# Patient Record
Sex: Male | Born: 1950 | Race: Asian | Hispanic: Yes | Marital: Married | Smoking: Former smoker
Health system: Southern US, Community
[De-identification: ages and names within clinical notes are randomized; demographics above are authoritative.]

## PROBLEM LIST (undated history)

## (undated) DIAGNOSIS — A809 Acute poliomyelitis, unspecified: Secondary | ICD-10-CM

## (undated) DIAGNOSIS — I10 Essential (primary) hypertension: Secondary | ICD-10-CM

---
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
## 2014-07-07 ENCOUNTER — Emergency Department (HOSPITAL_COMMUNITY)
Admission: EM | Admit: 2014-07-07 | Discharge: 2014-07-08 | Disposition: A | Discharge status: Home / Self Care | Payer: Medicare Other | Attending: Emergency Medicine | Admitting: Emergency Medicine

## 2014-07-07 ENCOUNTER — Emergency Department (HOSPITAL_COMMUNITY): Payer: Medicare Other

## 2014-07-07 ENCOUNTER — Encounter (HOSPITAL_COMMUNITY): Payer: Self-pay | Admitting: Emergency Medicine

## 2014-07-07 DIAGNOSIS — Z87891 Personal history of nicotine dependence: Secondary | ICD-10-CM | POA: Insufficient documentation

## 2014-07-07 DIAGNOSIS — Z7982 Long term (current) use of aspirin: Secondary | ICD-10-CM | POA: Insufficient documentation

## 2014-07-07 DIAGNOSIS — IMO0002 Reserved for concepts with insufficient information to code with codable children: Secondary | ICD-10-CM | POA: Insufficient documentation

## 2014-07-07 DIAGNOSIS — S42211A Unspecified displaced fracture of surgical neck of right humerus, initial encounter for closed fracture: Secondary | ICD-10-CM

## 2014-07-07 DIAGNOSIS — S6990XA Unspecified injury of unspecified wrist, hand and finger(s), initial encounter: Secondary | ICD-10-CM | POA: Insufficient documentation

## 2014-07-07 DIAGNOSIS — W010XXA Fall on same level from slipping, tripping and stumbling without subsequent striking against object, initial encounter: Secondary | ICD-10-CM | POA: Insufficient documentation

## 2014-07-07 DIAGNOSIS — S42213A Unspecified displaced fracture of surgical neck of unspecified humerus, initial encounter for closed fracture: Secondary | ICD-10-CM | POA: Insufficient documentation

## 2014-07-07 DIAGNOSIS — I10 Essential (primary) hypertension: Secondary | ICD-10-CM | POA: Insufficient documentation

## 2014-07-07 DIAGNOSIS — S62319A Displaced fracture of base of unspecified metacarpal bone, initial encounter for closed fracture: Secondary | ICD-10-CM | POA: Insufficient documentation

## 2014-07-07 DIAGNOSIS — Z79899 Other long term (current) drug therapy: Secondary | ICD-10-CM | POA: Insufficient documentation

## 2014-07-07 DIAGNOSIS — Y9389 Activity, other specified: Secondary | ICD-10-CM | POA: Diagnosis not present

## 2014-07-07 DIAGNOSIS — Y9289 Other specified places as the place of occurrence of the external cause: Secondary | ICD-10-CM | POA: Diagnosis not present

## 2014-07-07 DIAGNOSIS — Z8619 Personal history of other infectious and parasitic diseases: Secondary | ICD-10-CM | POA: Diagnosis not present

## 2014-07-07 DIAGNOSIS — S62511A Displaced fracture of proximal phalanx of right thumb, initial encounter for closed fracture: Secondary | ICD-10-CM

## 2014-07-07 HISTORY — DX: Acute poliomyelitis, unspecified: A80.9

## 2014-07-07 HISTORY — DX: Essential (primary) hypertension: I10

## 2014-07-07 NOTE — ED Notes (Signed)
Pt states he has hx of polio and had sx on his rt arm when he was a child to repair damage from the polio. Arm has limited movement. Pt states that he tripped and fell and landed on the right arm. Now his arm cannot move at all.

## 2014-07-08 ENCOUNTER — Emergency Department (HOSPITAL_COMMUNITY): Payer: Medicare Other

## 2014-07-08 MED ORDER — HYDROCODONE-ACETAMINOPHEN 5-325 MG PO TABS: 1.0000 | ORAL_TABLET | ORAL | Status: AC | PRN

## 2014-07-08 MED ORDER — HYDROCODONE-ACETAMINOPHEN 5-325 MG PO TABS
2.0000 | ORAL_TABLET | Freq: Once | ORAL | Status: AC
  Administered 2014-07-08: 2 via ORAL
  Filled 2014-07-08: qty 2

## 2014-07-08 NOTE — ED Provider Notes (Addendum)
CSN: [id number]     Arrival date & time 07/07/14  2245 History   First MD Initiated Contact with Patient 07/07/14 2335     Chief Complaint  Patient presents with  . 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 Patient states he "got his foot stuck in a chair, fell to the floor landing on his right elbow and right wrist approximately 2 hours ago. Patient complaining of right shoulder pain, right wrist pain.  Patient has limited function from residual neuro deficit from polio when he was [age] years of age. Patient denies any other injury, loss of consciousness. Patient denies any weakness, numbness, tingling. Patient describes his pain as a sharp, constant pain in his shoulder and his right wrist. He states the pain does not radiate, and he has not tried taking anything to help alleviate his pain. He states there are no alleviating factors, any movement is an aggravating factor.  Past Medical History  Diagnosis Date  . Polio   . Hypertension    Past Surgical History  Procedure Laterality Date  . Shoulder surgery  1963   No family history on file. History  Substance Use Topics  . Smoking status: Former Smoker    Quit date: 03/06/2014  . Smokeless tobacco: Never Used  . Alcohol Use: Yes     Comment: socially    Review of Systems  Musculoskeletal: Positive for arthralgias.  Neurological: Negative for syncope, weakness and numbness.      Allergies  Review of patient's allergies indicates no known allergies.  Home Medications   Prior to Admission medications   Medication Sig Start Date End Date Taking? Authorizing Provider  Ascorbic Acid (VITAMIN C) 1000 MG tablet Take 1,000 mg by mouth daily.   Yes Historical Provider, MD  aspirin EC 81 MG tablet Take 81 mg by mouth  daily.   Yes Historical Provider, MD  HYDROcodone-acetaminophen (NORCO/VICODIN) 5-325 MG per tablet Take 1-2 tablets by mouth every 4 (four) hours as needed for moderate pain or severe pain. 07/08/14   Monte Fantasia, PA-C  lisinopril (PRINIVIL,ZESTRIL) 10 MG tablet Take 10 mg by mouth daily.   Yes Historical Provider, MD  VITAMIN D, CHOLECALCIFEROL, PO Take 1 capsule by mouth daily.   Yes Historical Provider, MD   BP 134/85  Pulse 91  Temp(Src) 98.1 F (36.7 C) (Oral)  Resp 16  Ht  (1.676 m)  Wt 165 lb (74.844 kg)  BMI 26.64 kg/m2  SpO2 95% Physical Exam  Nursing note and vitals reviewed. Constitutional: He appears well-developed and well-nourished. No distress.  HENT:  Head: Normocephalic and atraumatic.  Eyes: Conjunctivae are normal. Right eye exhibits no discharge. Left eye exhibits no discharge. No scleral icterus.  Cardiovascular:  Peripheral pulses intact at injured extremity.   Pulmonary/Chest: Effort normal. No respiratory distress.  Musculoskeletal:  Right shoulder exam: Range of motion limited due to to past glenohumeral fusion. Tenderness to palpation of proximal humeral region. Patient states she does not have much range of motion of the shoulder at baseline. Tenderness noted to pt's thumb. No anatomic snuffbox tenderness. Pt has full passive ROM to his wrist without tenderness. Pt has limited active ROM of his wrist which he states is baseline for him. Mild swelling is noted to the lateral portion of his thumb and distal metacarpals. Mild ecchymosis noted on exam.  Radial  pulse 2+, capillary refill less than 2 seconds, patient able to move all fingers. Patient states he does not feel he has lost any function of his fingers or hand.  Motor strength difficult to assess due to patient's residual neurologic deficit from his polio. Patient states he does not feel as though he has lost any strength or function from his baseline in his right hand. Distal sensation intact.    Neurological: He is alert.  No numbness distal to injury.    Skin: Skin is warm and dry. No rash noted. He is not diaphoretic.    ED Course  Procedures (including critical care time) Labs Review Labs Reviewed - No data to display  Imaging Review Dg Shoulder Right  07/08/2014   CLINICAL DATA:  Right shoulder her wrist, hand pain after colon. History of polio and shoulder surgery.  EXAM: RIGHT SHOULDER - 2+ VIEW  COMPARISON:  None.  FINDINGS: Positioning is nonstandard. There is no acute fracture of the right humeral neck. Suspect chronic fused glenohumeral joint. Apex of the lung is unremarkable.  IMPRESSION: Acute fracture of the right humeral neck.  Fused glenohumeral joint.   Electronically Signed   By: Rosalie Gums M.D.   On: 07/08/2014 00:34   Dg Wrist Complete Right  07/08/2014   CLINICAL DATA:  Wrist and hand pain after falling. History of polio.  EXAM: RIGHT WRIST - COMPLETE 3+ VIEW  COMPARISON:  None.  FINDINGS: There is deformity of the wrist. Ulna minus variance is noted. There is lucency traversing the distal radius, raising the question of minimally displaced fracture. There is an acute fracture of the proximal phalanx of the thumb and base of the third metacarpal. Deformity of the second metacarpal may be chronic and is not completely evaluated. Dedicated views of the hand may be helpful.  IMPRESSION: 1. Deformity of the wrist, chronic deformity of the wrist. Question of an oblique fracture of the distal radius with minimal displacement. 2. Acute fractures of the proximal phalanx of the thumb and the third metacarpal. 3. Deformity of the second metacarpal. 4. Further evaluation hand is recommended.   Electronically Signed   By: Rosalie Gums M.D.   On: 07/08/2014 00:38   Dg Hand Complete Right  07/08/2014   CLINICAL DATA:  Question of fracture on previous wrist x-ray.  EXAM: RIGHT HAND - COMPLETE 3+ VIEW  COMPARISON:  07/07/2014  FINDINGS: There is deformity of the wrist, consistent  with a history of polio. Ulnar minus variance is again noted. There is an acute fracture of the proximal phalanx of the thumb. Third proximal metacarpal fracture is again noted. Deformity of the second metacarpal is consistent with old injury. No definite fracture identified in the distal radius. There is a shortened fourth metacarpal.  IMPRESSION: 1. Acute fractures of the proximal phalanx of the thumb and the proximal aspect of the third metacarpal. 2. Probable old fracture of the second metacarpal. 3. Fracture the distal radius is felt to be less likely.   Electronically Signed   By: Rosalie Gums M.D.   On: 07/08/2014 02:03     EKG Interpretation None      MDM   Final diagnoses:  Fracture of proximal phalanx of right thumb, closed, initial encounter  Fracture of metacarpal base of right hand, closed, initial encounter  Humeral surgical neck fracture, right, closed, initial encounter    63 year old male past medical history of polio with residual neurologic deficit in right upper extremity, presenting after a fall on  right upper extremity.  Radiographs shows impression of: Right hand/wrist: 1. Acute fractures of the proximal phalanx of the thumb and the proximal aspect of the third metacarpal. 2. Probable old fracture of the second metacarpal.  Right shoulder:  Acute fracture of the right humeral neck. Fused glenohumeral joint.  Patient's pain treated in the ER. Patient's arm is placed in a sling, which patient states alleviated his pain a remarkable amount. With patient having very limited function of his right forearm and right hand, the decision was made not to splint his forearm due to the fact that he typically does not move his right forearm, and he has a chronic deformity of the right hand.  No anatomic snuffbox tenderness, pt has full passive ROM of his wrist without pain.  Splinting it could potentially make his arm heavier, and cause more discomfort for his upper arm in the  sling. Patient states he typically keeps his hand and wrist still, and agrees that having his wrist lay in the sling without splinting may actually be more comfortable for him do to his chronic deformity of his right hand. Patient discharged with pain medications and strongly encouraged to followup with orthopedics and hand surgery in the morning. Patient was encouraged to call or return to the ER should his symptoms worsen, or should he have any questions or concerns.   BP 134/85  Pulse 91  Temp(Src) 98.1 F (36.7 C) (Oral)  Resp 16  Ht  (1.676 m)  Wt 165 lb (74.844 kg)  BMI 26.64 kg/m2  SpO2 95%   Signed,  Ladona Mow, PA-C 4:02 AM  Pt discussed with Dr Tomasita Crumble, MD.        Monte Fantasia, PA-C 07/08/14 0402  Monte Fantasia, PA-C 07/11/14 1649

## 2014-07-08 NOTE — Discharge Instructions (Signed)
Use hydrocodone 1-2 pills every 4-6 hours as needed for pain. Followup with orthopedics, and hand surgery in the morning. Return to the ER should your symptoms worsen, or should he have a questions or concerns.   Humerus Fracture, Treated with Immobilization The humerus is the large bone in your upper arm. You have a broken (fractured) humerus. These fractures are easily diagnosed with X-rays. TREATMENT  Simple fractures which will heal without disability are treated with simple immobilization. Immobilization means you will wear a cast, splint, or sling. You have a fracture which will do well with immobilization. The fracture will heal well simply by being held in a good position until it is stable enough to begin range of motion exercises. Do not take part in activities which would further injure your arm.  HOME CARE INSTRUCTIONS   Put ice on the injured area.  Put ice in a plastic bag.  Place a towel between your skin and the bag.  Leave the ice on for 15-20 minutes, 03-04 times a day.  If you have a cast:  Do not scratch the skin under the cast using sharp or pointed objects.  Check the skin around the cast every day. You may put lotion on any red or sore areas.  Keep your cast dry and clean.  If you have a splint:  Wear the splint as directed.  Keep your splint dry and clean.  You may loosen the elastic around the splint if your fingers become numb, tingle, or turn cold or blue.  If you have a sling:  Wear the sling as directed.  Do not put pressure on any part of your cast or splint until it is fully hardened.  Your cast or splint can be protected during bathing with a plastic bag. Do not lower the cast or splint into water.  Only take over-the-counter or prescription medicines for pain, discomfort, or fever as directed by your caregiver.  Do range of motion exercises as instructed by your caregiver.  Follow up as directed by your caregiver. This is very important in  order to avoid permanent injury or disability and chronic pain. SEEK IMMEDIATE MEDICAL CARE IF:   Your skin or nails in the injured arm turn blue or gray.  Your arm feels cold or numb.  You develop severe pain in the injured arm.  You are having problems with the medicines you were given. MAKE SURE YOU:   Understand these instructions.  Will watch your condition.  Will get help right away if you are not doing well or get worse. Document Released: 01/17/2001 Document Revised: 01/03/2012 Document Reviewed: 11/25/2010 [facility] Patient Information 2015 [place], [place]. This information is not intended to replace advice given to you by your health care provider. Make sure you discuss any questions you have with your health care provider.   Metacarpal Fractures Fractures of metacarpals are breaks in the bones of the hand. They extend from the knuckles to the wrist. These bones can break in many ways. There are different ways of treating these fractures. HOME CARE  Only exercise as told by your doctor.  Return to activities as told by your doctor.  Go to physical therapy as told by your doctor.  Follow your doctor's advice about driving.  Keep the injured hand raised (elevated) above the level of your heart.  If a plaster, fiberglass, or pre-formed splint was applied:  Wear your splint as told and until you are examined again.  Apply ice on the injury  for 15-20 minutes at a time, 03-04 times a day. Put the ice in a plastic bag. Place a towel between your skin and the bag.  Do not get your splint or cast wet. Protect it during bathing with a plastic bag.  Loosen the elastic bandage around the splint if your fingers start to get numb, tingle, get cold, or turn blue.  If the splint is plaster, do not lean it on hard surfaces or put pressure on it for 24 hours after it is put on.  Do not  try to scratch the skin under the cast.  Check the skin around the cast every day. You may  put lotion on red or sore areas.  Move the fingers of your casted hand several times a day.  Only take medicine as told by your doctor.  Follow up as told by your doctor. This is very important in order to avoid permanent injury, disability, or lasting (chronic) pain. GET HELP RIGHT AWAY IF:   You develop a rash.  You have problems breathing.  You have any allergy problems.  You have more than a small spot of blood from beneath your cast or splint.  You have redness, puffiness (swelling), or more pain from beneath your cast or splint.  Yellowish-white fluid (pus) comes from beneath your cast or splint.  You develop a temperature by mouth above 102 F (38.9 C), not controlled by medicine.  You have a bad smell coming from under your cast or splint.  You have problems moving any of your fingers. If you do not have a window in your cast for looking at the wound, a fluid or a little bleeding may show up as a stain on the outside of your cast. Tell your doctor about any stains you see. MAKE SURE YOU:   Understand these instructions.  Will watch your condition.  Will get help right away if you are not doing well or get worse. Document Released: 03/29/2008 Document Revised: 02/25/2014 Document Reviewed: 09/16/2009 [facility] Patient Information 2015 [place], [place]. This information is not intended to replace advice given to you by your health care provider. Make sure you discuss any questions you have with your health care provider.

## 2014-07-08 NOTE — ED Provider Notes (Signed)
Medical screening examination/treatment/procedure(s) were performed by non-physician practitioner and as supervising physician I was immediately available for consultation/collaboration.   EKG Interpretation None        Tomasita Crumble, MD 07/08/14 1620

## 2014-07-12 NOTE — ED Provider Notes (Signed)
Medical screening examination/treatment/procedure(s) were performed by non-physician practitioner and as supervising physician I was immediately available for consultation/collaboration.   EKG Interpretation None        Shlonda Dolloff, MD 07/12/14 1824 

## 2015-04-18 IMAGING — CR DG SHOULDER 2+V*R*
2 series · 2 of 2 positions shown · non-contrast
Comparison: None.

CLINICAL DATA: Right shoulder her wrist, hand pain after colon.
History of polio and shoulder surgery.

EXAM:
RIGHT SHOULDER - 2+ VIEW

[w shoulder internal right]
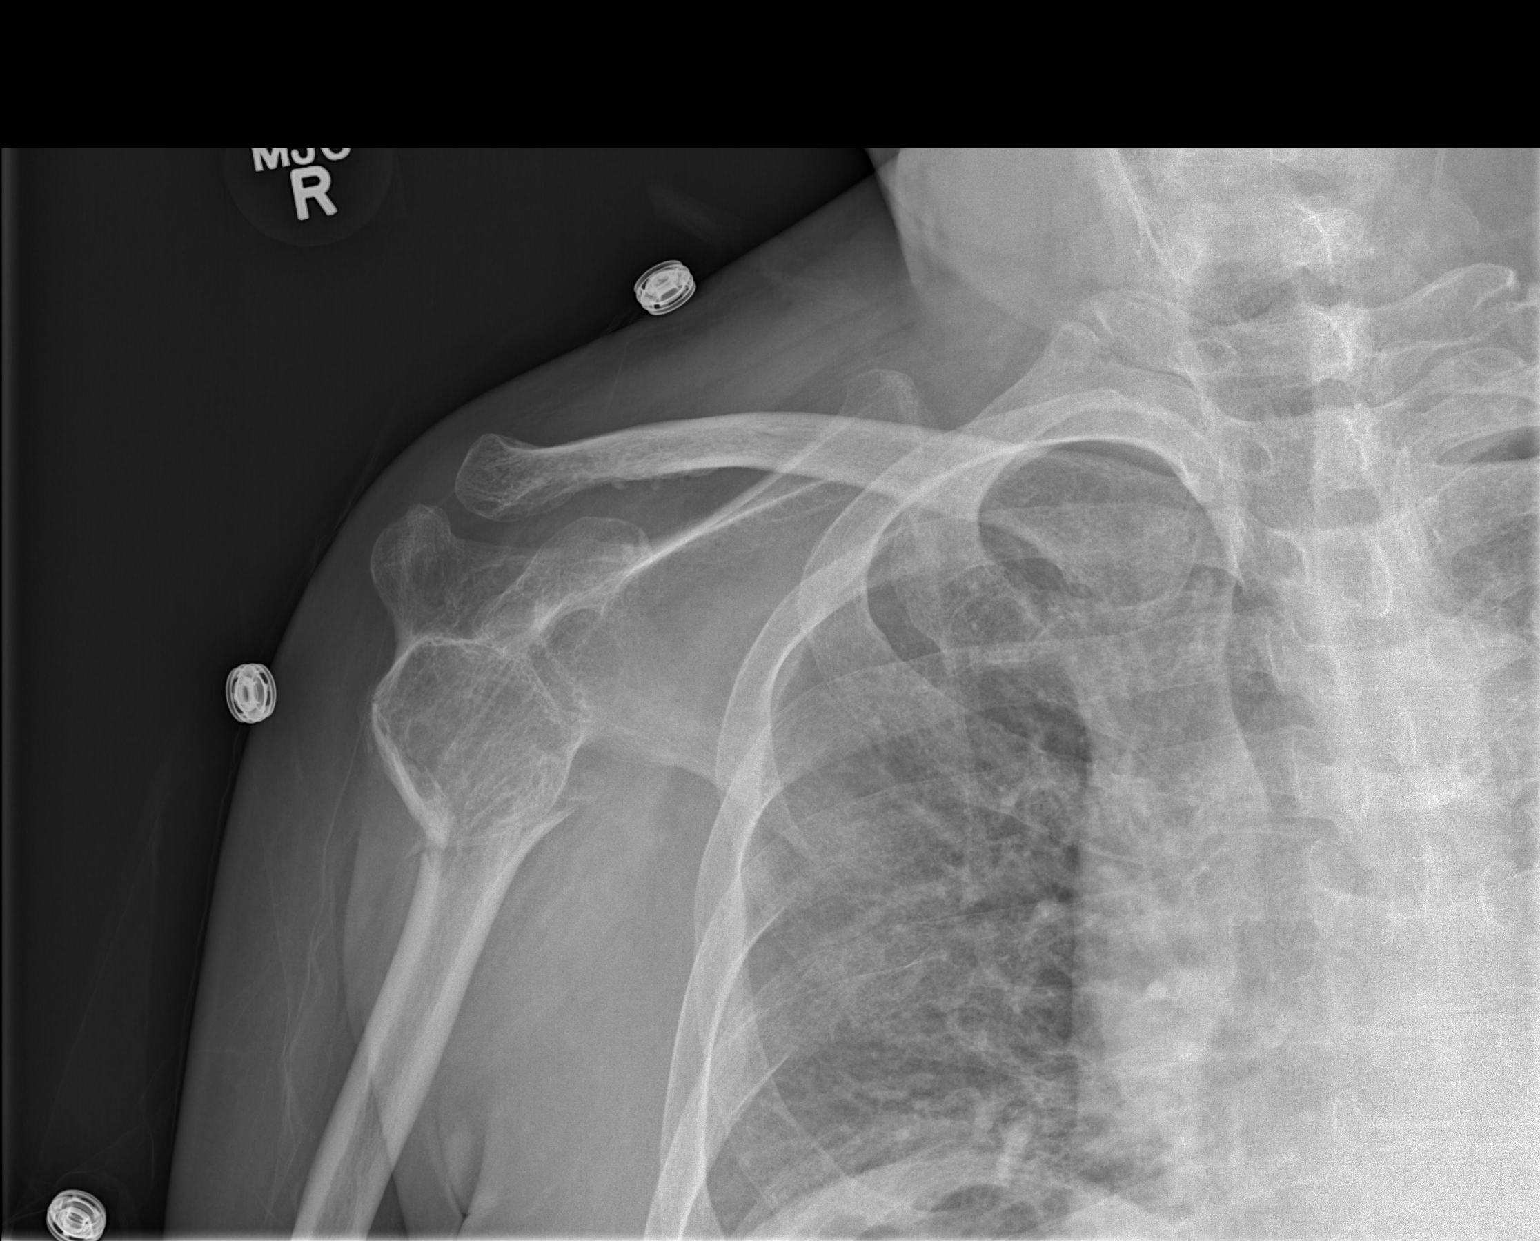

[w shoulder y-view right]
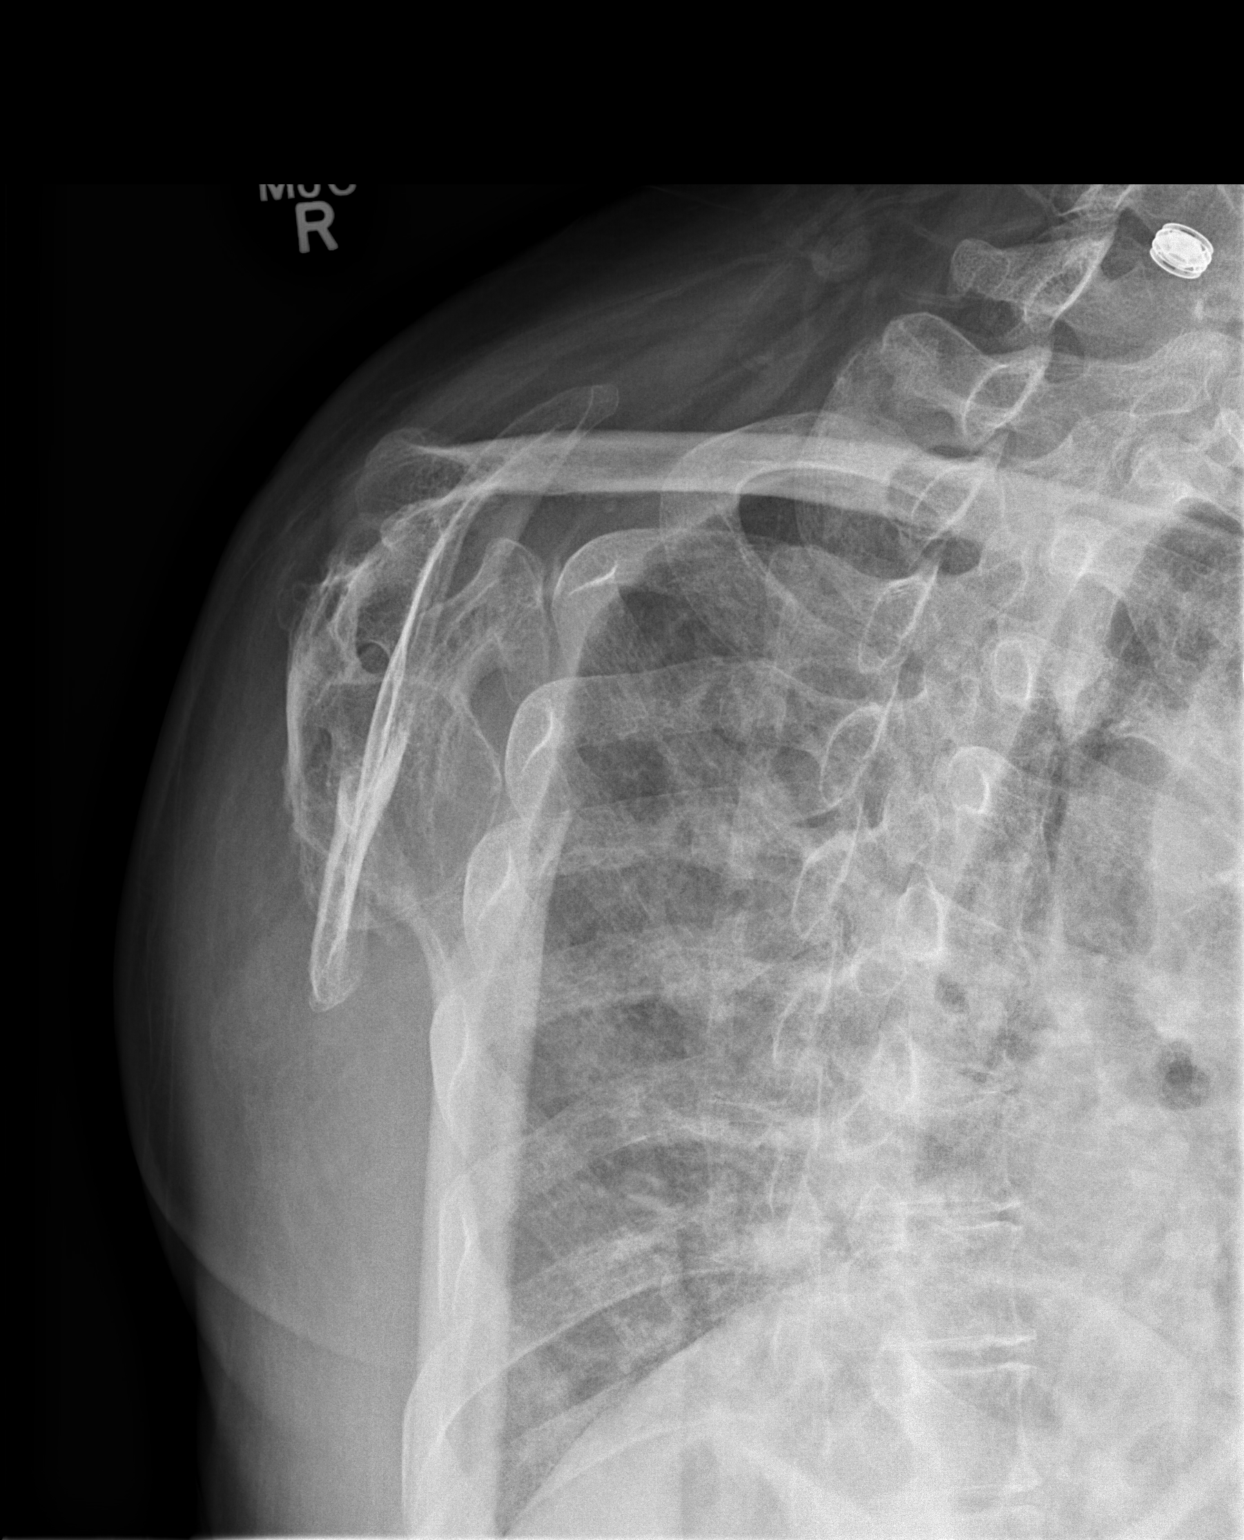

[2 of 2 positions shown; findings below may reference images not displayed]

FINDINGS: Positioning is nonstandard. There is no acute fracture of the right
humeral neck. Suspect chronic fused glenohumeral joint. Apex of the
lung is unremarkable.
IMPRESSION: Acute fracture of the right humeral neck.  Fused glenohumeral joint.

## 2015-04-19 IMAGING — CR DG HAND COMPLETE 3+V*R*
3 series · 3 of 3 positions shown · non-contrast
Comparison: 07/07/2014

CLINICAL DATA: Question of fracture on previous wrist x-ray.

EXAM:
RIGHT HAND - COMPLETE 3+ VIEW

[x hand pa right]
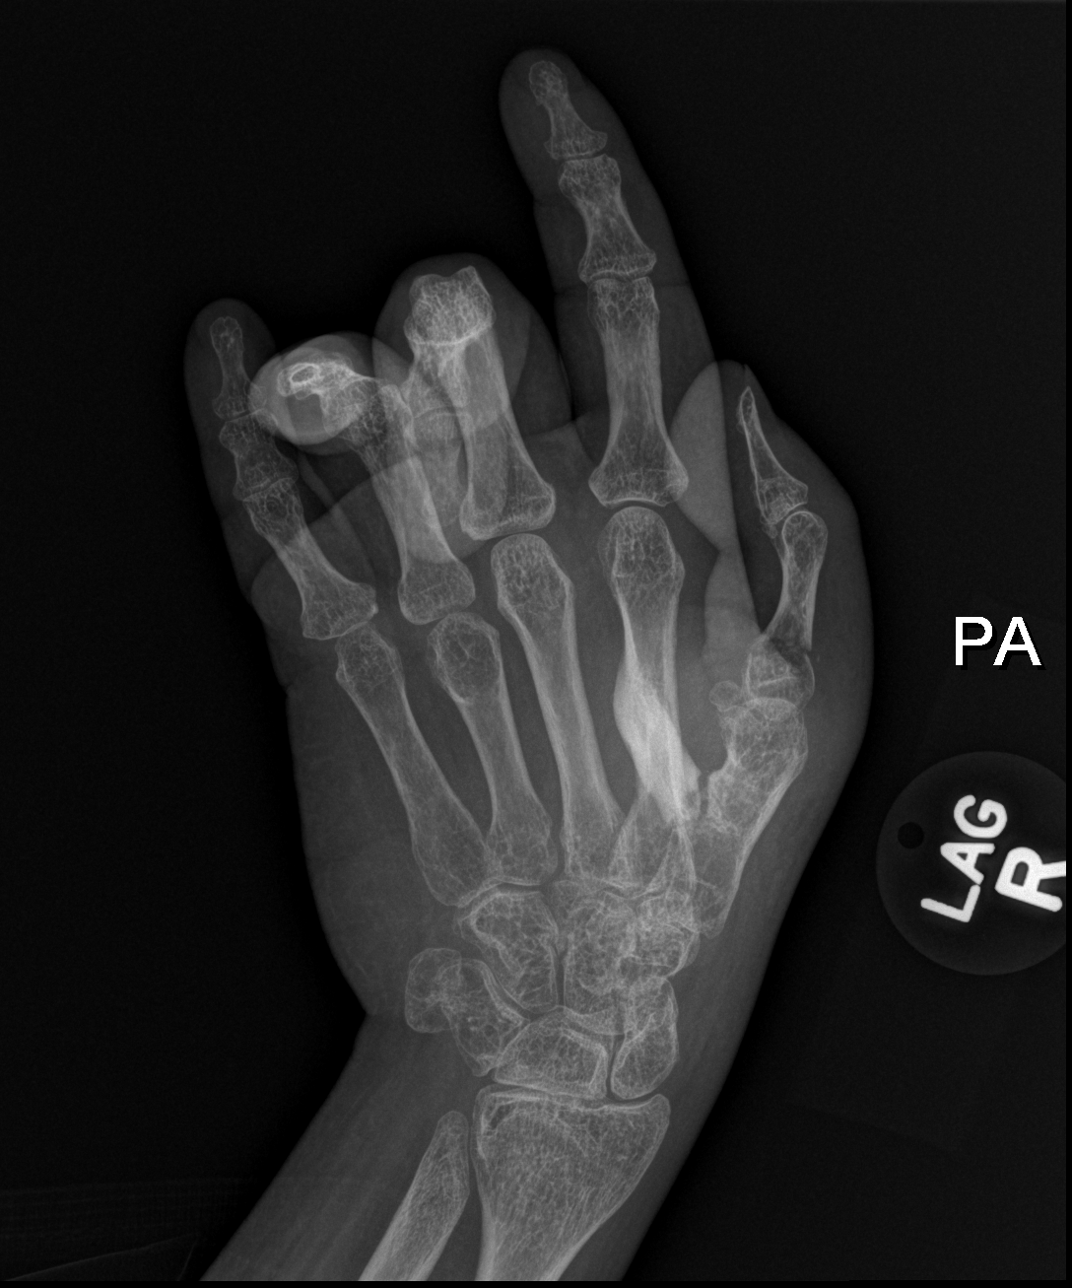

[x hand lat right]
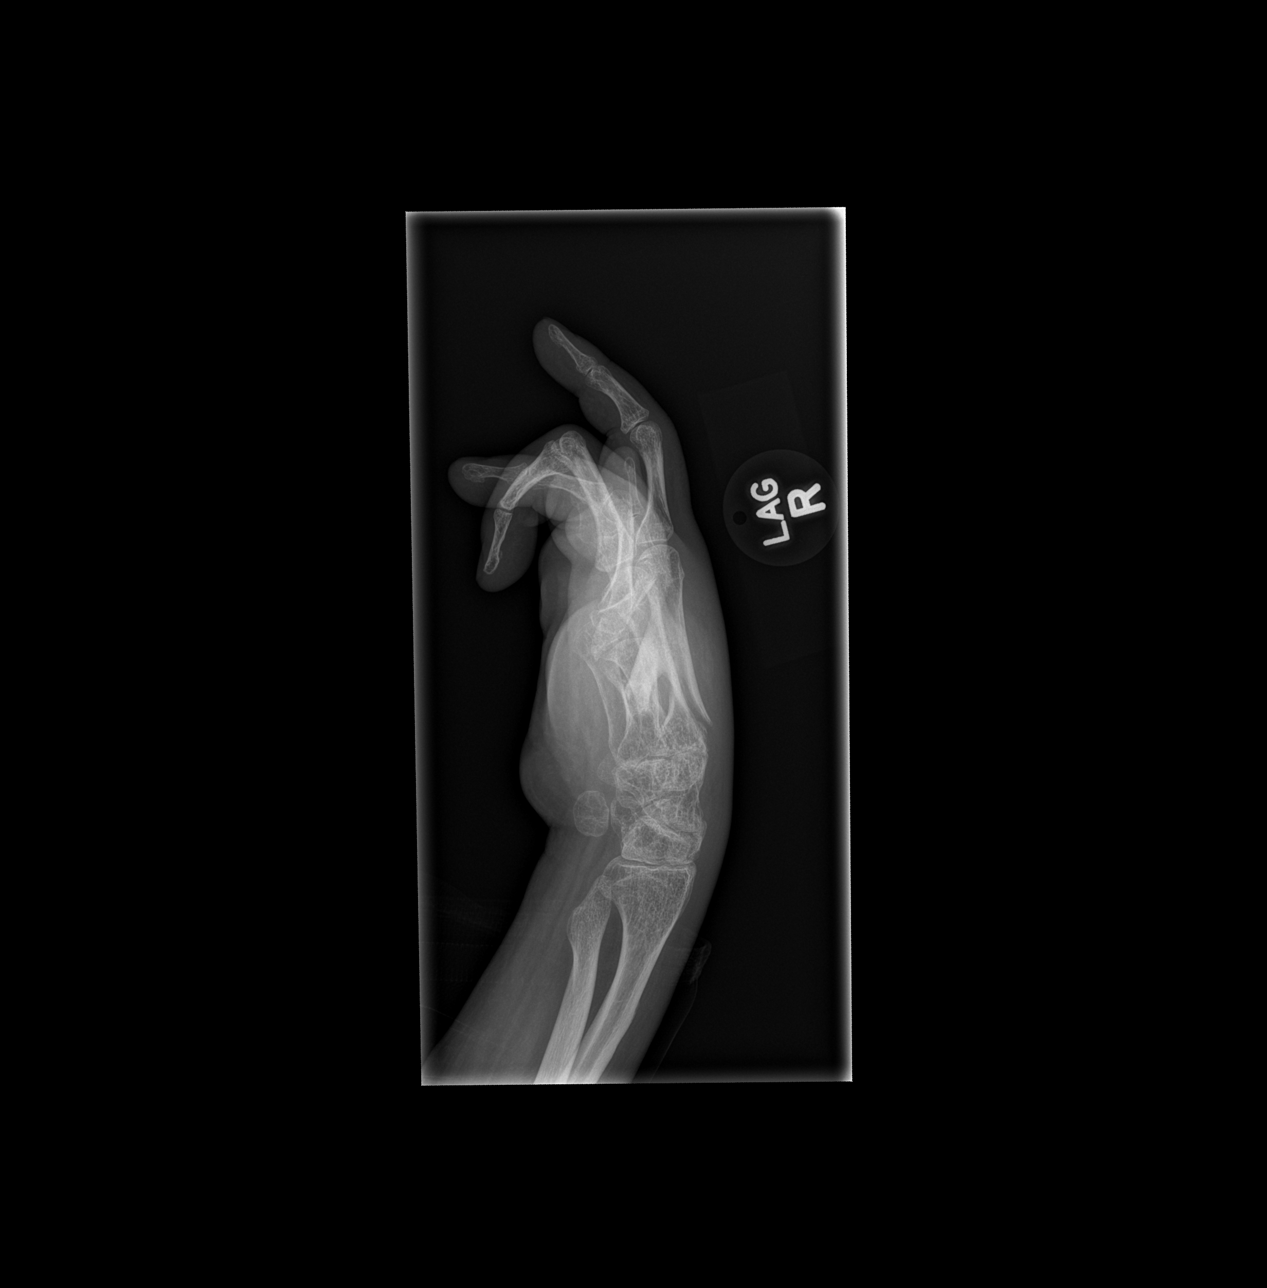

[x hand obl right]
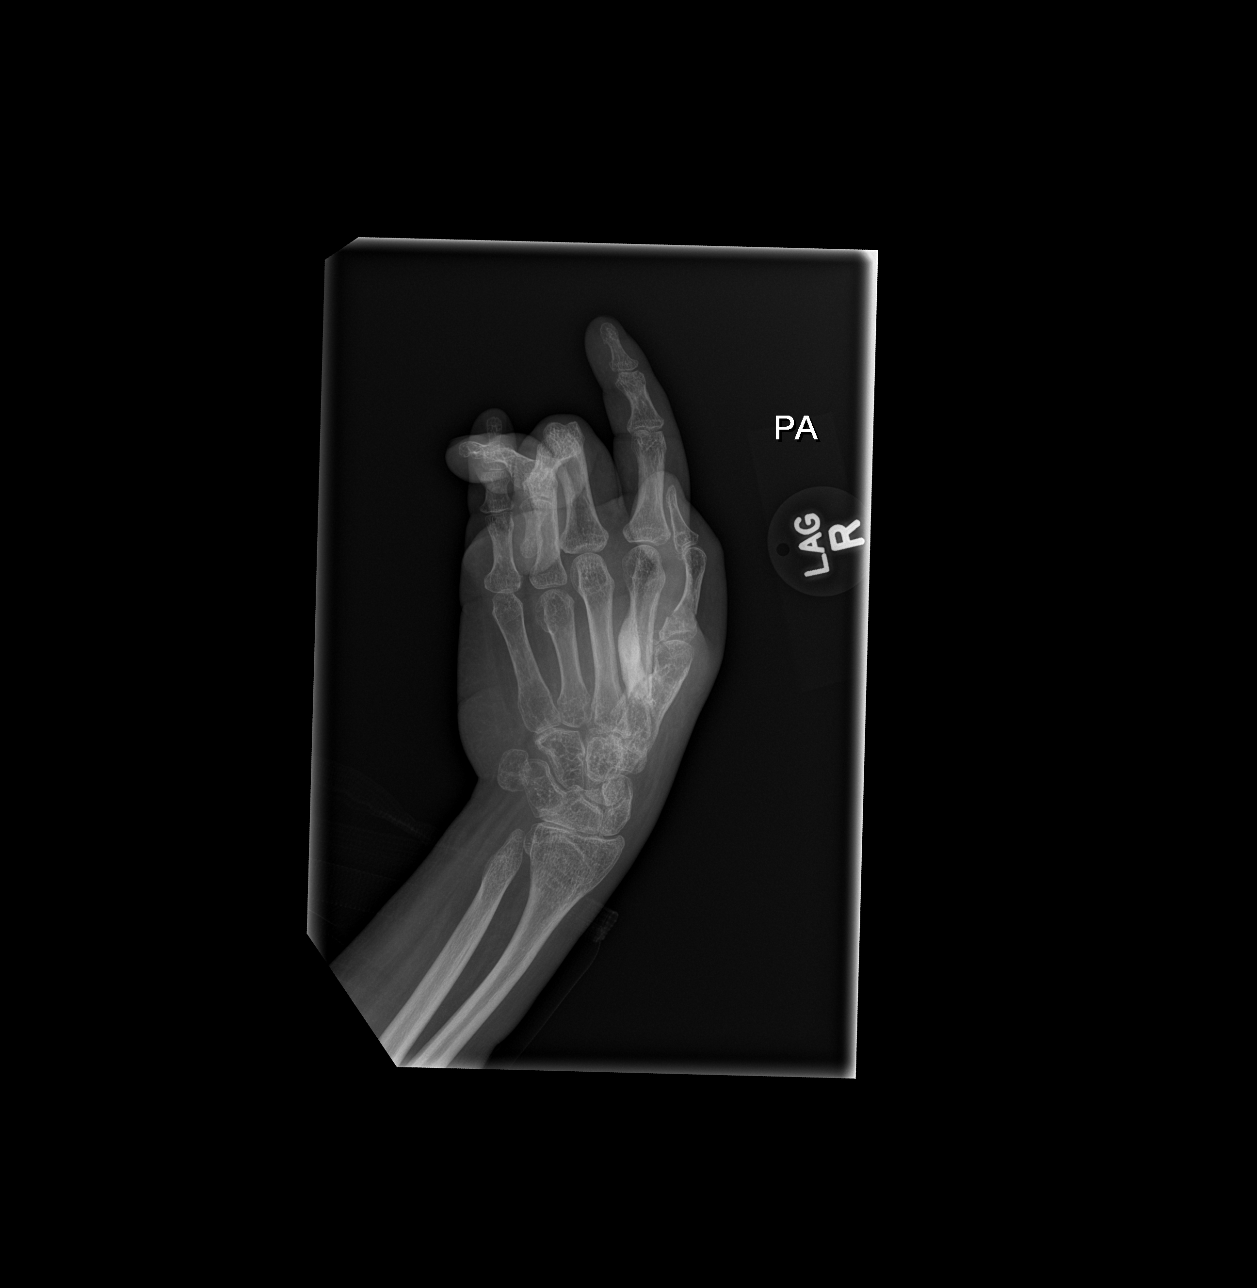

[3 of 3 positions shown; findings below may reference images not displayed]

FINDINGS: There is deformity of the wrist, consistent with a history of polio.
Ulnar minus variance is again noted. There is an acute fracture of
the proximal phalanx of the thumb. Third proximal metacarpal
fracture is again noted. Deformity of the second metacarpal is
consistent with old injury. No definite fracture identified in the
distal radius. There is a shortened fourth metacarpal.
IMPRESSION: 1. Acute fractures of the proximal phalanx of the thumb and the
proximal aspect of the third metacarpal.
2. Probable old fracture of the second metacarpal.
3. Fracture the distal radius is felt to be less likely.

## 2016-12-23 DEATH — deceased
